# Patient Record
Sex: Male | Born: 1985 | Race: White | Hispanic: No | Marital: Married | Smoking: Never smoker
Health system: Southern US, Community
[De-identification: ages and names within clinical notes are randomized; demographics above are authoritative.]

## PROBLEM LIST (undated history)

## (undated) DIAGNOSIS — F32A Depression, unspecified: Secondary | ICD-10-CM

## (undated) DIAGNOSIS — F329 Major depressive disorder, single episode, unspecified: Secondary | ICD-10-CM

## (undated) HISTORY — PX: WISDOM TOOTH EXTRACTION: SHX21

---
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
## 2006-02-18 ENCOUNTER — Encounter: Admission: RE | Admit: 2006-02-18 | Discharge: 2006-02-18 | Payer: Self-pay | Admitting: Gastroenterology

## 2008-10-13 IMAGING — CT CT ENTERO ABD W/CM
2 of 6 series · 17 of 46 positions shown, 19 images · IV contrast (VOLUMEN & [ID] OMNI 300)
Comparison: Upper GI of 12/10/04.

CLINICAL DATA: Abdominal pain, question Crohn disease.
 CT ENTEROGRAPHY:
TECHNIQUE: Multidetector CT imaging of the abdomen and pelvis was performed following administration of 1852 cc of VoLumen low attenuation enteric contrast.  Images were acquired after bolus injection of nonionic intravenous contrast with coronal and sagittal reformations reviewed for assessment of the small bowel mucosa.

[Series 102: enterography study · axial · 0.78mm/px · z∈[-454,-39]mm · 14 of 190 slices shown, 16 images]
[im 12/190  soft-tissue]
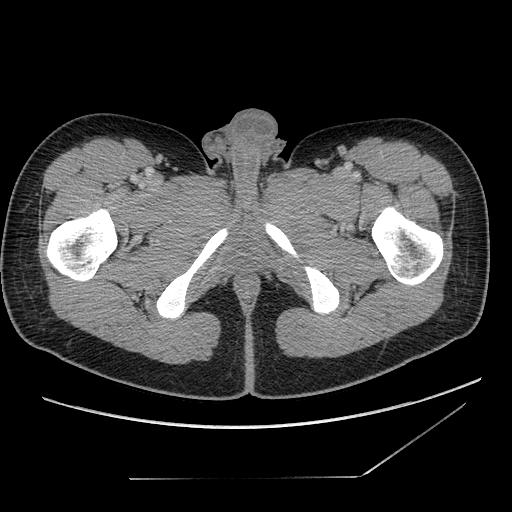
[im 12/190  bone]
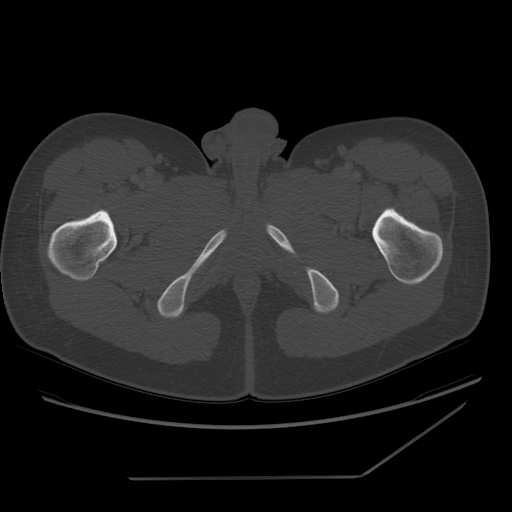
[im 23/190  soft-tissue]
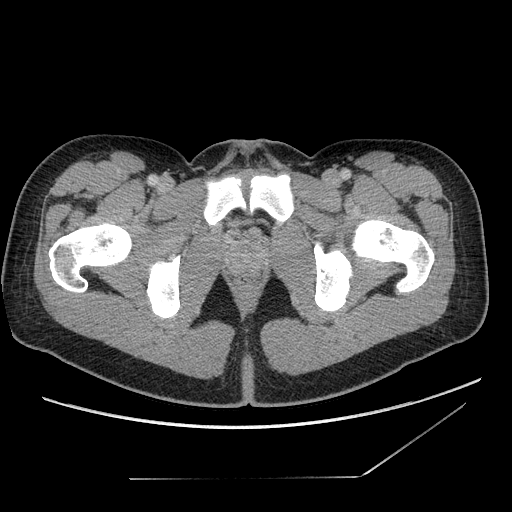
[im 34/190  soft-tissue]
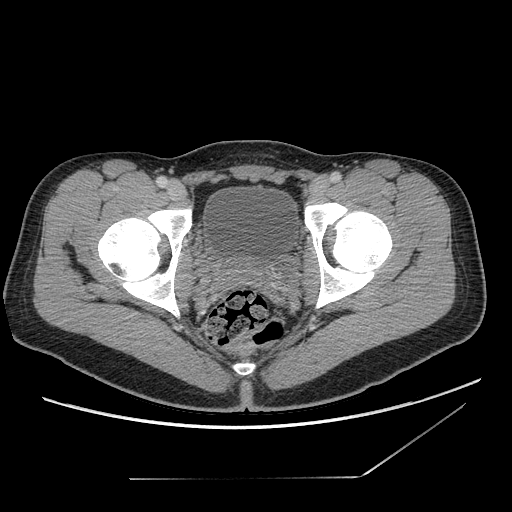
[im 56/190  soft-tissue]
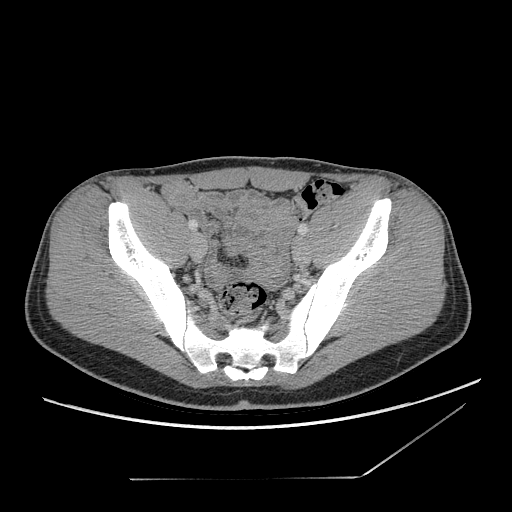
[im 67/190  soft-tissue]
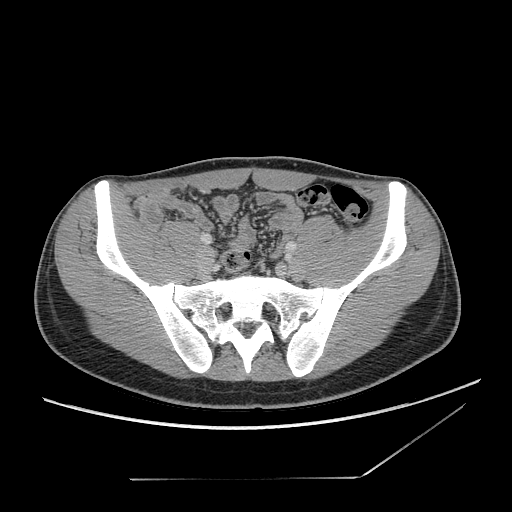
[im 78/190  soft-tissue]
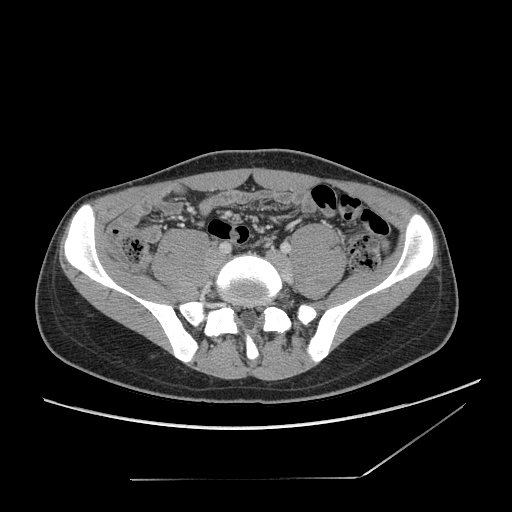
[im 89/190  soft-tissue]
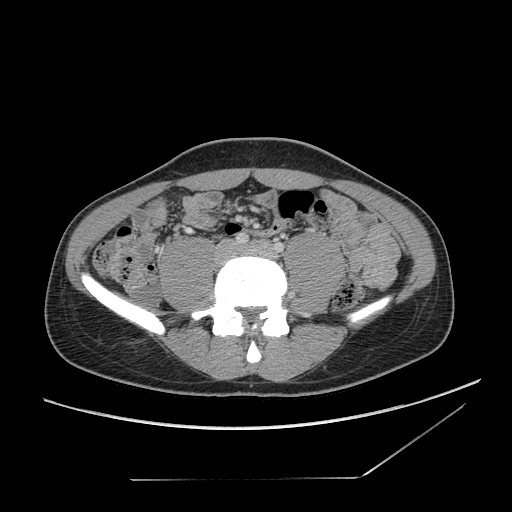
[im 101/190  soft-tissue]
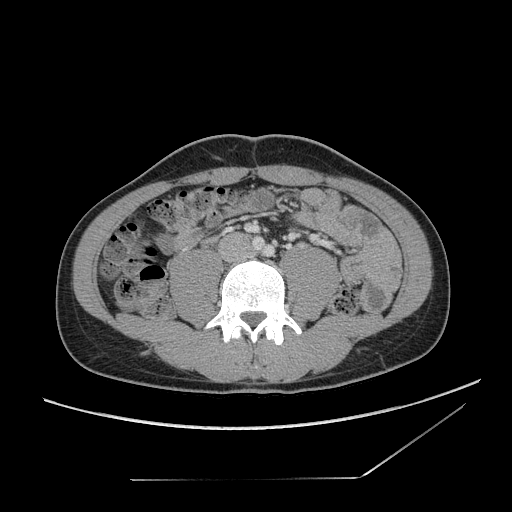
[im 112/190  soft-tissue]
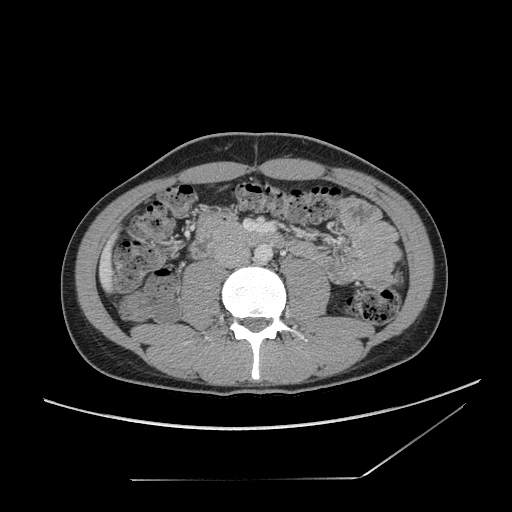
[im 112/190  bone]
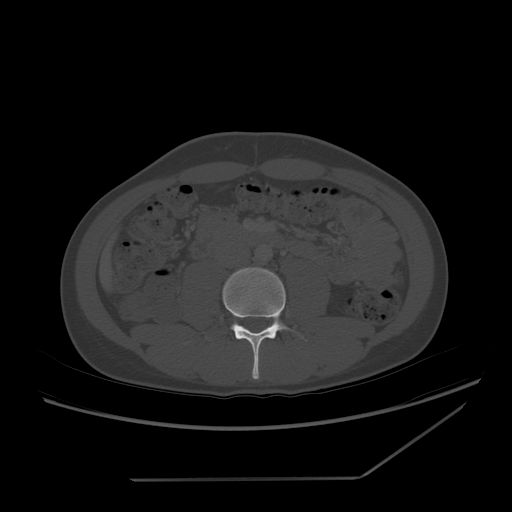
[im 123/190  soft-tissue]
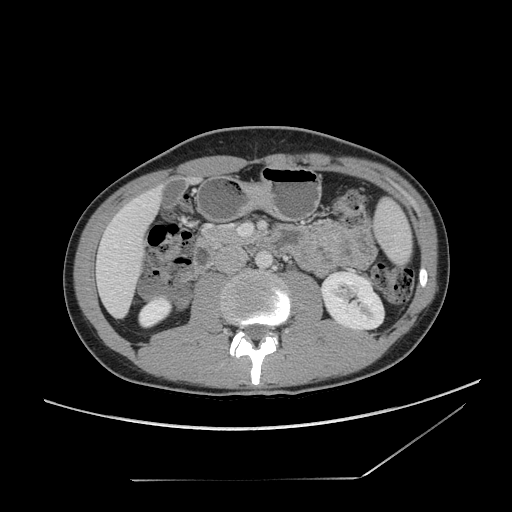
[im 145/190  soft-tissue]
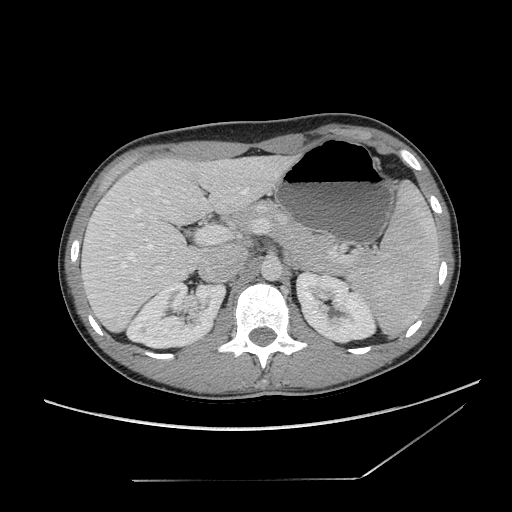
[im 156/190  soft-tissue]
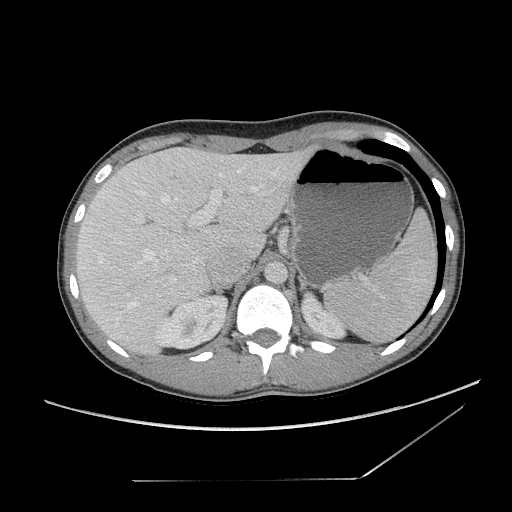
[im 167/190  soft-tissue]
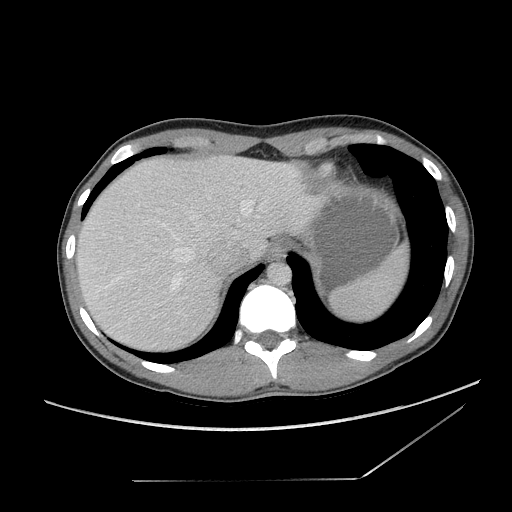
[im 178/190  soft-tissue]
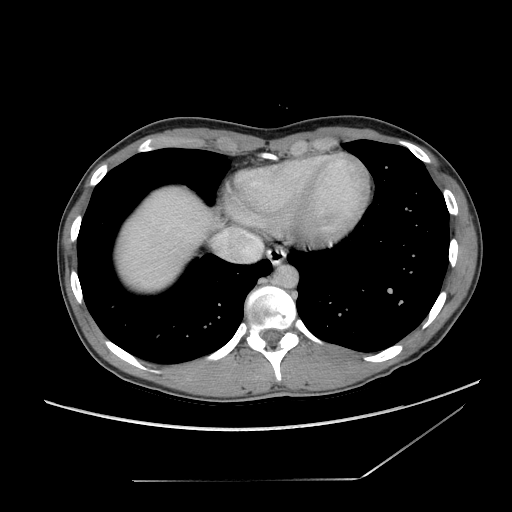

[Series 602: sagittal body · sagittal · 1.00mm/px · 3 of 156 slices shown]
[im 52/156  soft-tissue]
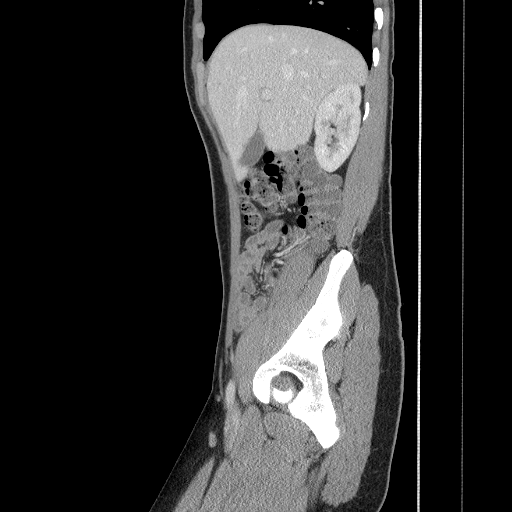
[im 69/156  soft-tissue]
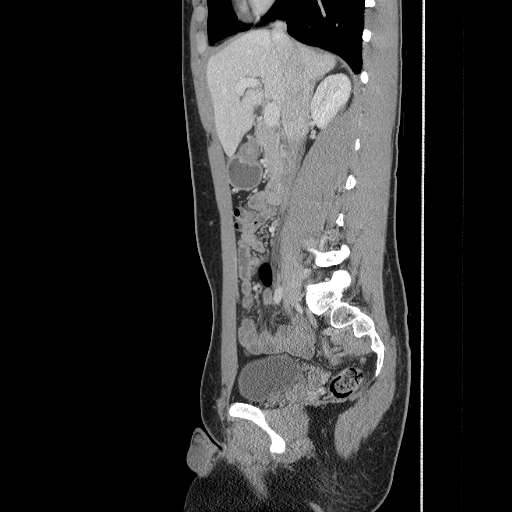
[im 87/156  soft-tissue]
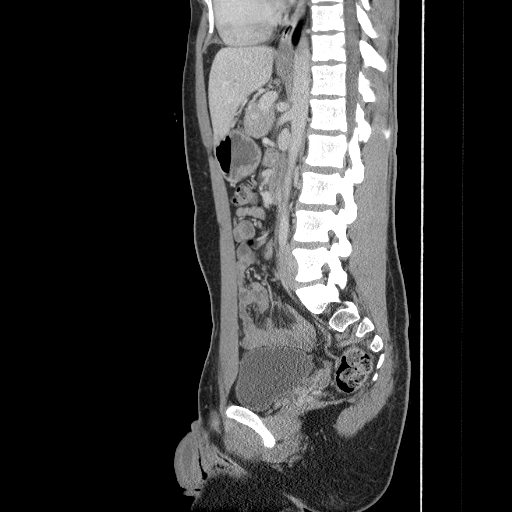

[17 of 46 positions shown; findings below may reference images not displayed]

No prior CT.
 Abdomen:   Lung bases are clear.  The heart size is normal.  There is no pericardial or pleural effusion.  The liver, spleen, stomach, pancreas, gallbladder, adrenal glands, and kidneys are normal.  Numerous right sided renal arteries.  No retroperitoneal or retrocrural adenopathy.
 Colon is normal. The appendix is likely visualized on image 119.  There is no evidence periappendiceal inflammation.  The ileocecal valve on image 104 and the terminal ileum is seen on image 110.  There is no evidence of hyperenhancement or wall thickening involving the terminal ileum.  
 No bowel wall thickening, distention, or stricture.  No perienteric edema or mesenteric adenopathy.
IMPRESSION: No acute process or evidence of Crohn disease in the abdomen.  
 Pelvis:  Pelvic small bowel loops are also normal.  Pelvic large bowel loops are within normal limits.  No ascites.  
 No pelvic adenopathy.  The urinary bladder and prostate are normal for age.  Bone windows demonstrate probable bone island in left iliac wing.
IMPRESSION: No acute findings in the pelvis.

## 2009-08-13 IMAGING — CR DG CHEST 1V
1 series · 2 of 2 positions shown · non-contrast
Comparison: NONE

CLINICAL DATA: Pre-employment. 

CHEST - SINGLE VIEW (PA)

[Series 1: view not recorded · 0.17mm/px · 2 of 2 slices shown]
[im 1/2]
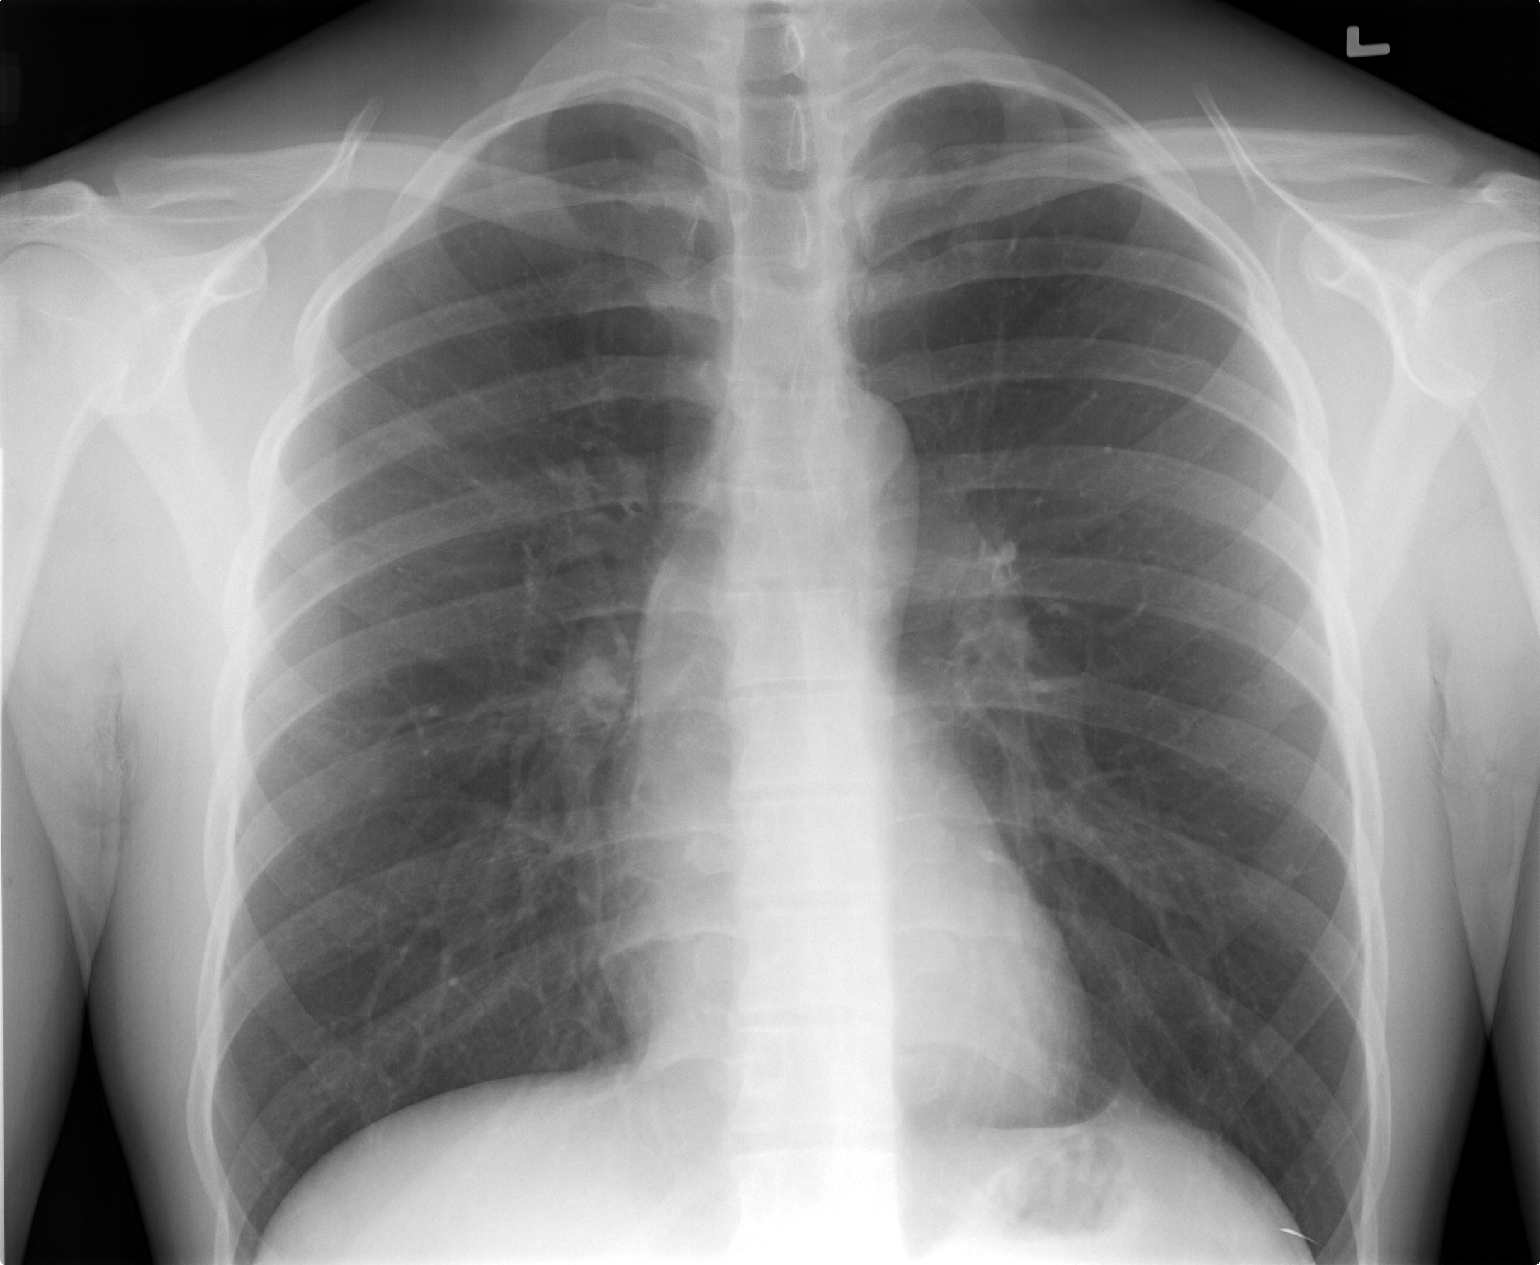
[im 2/2]
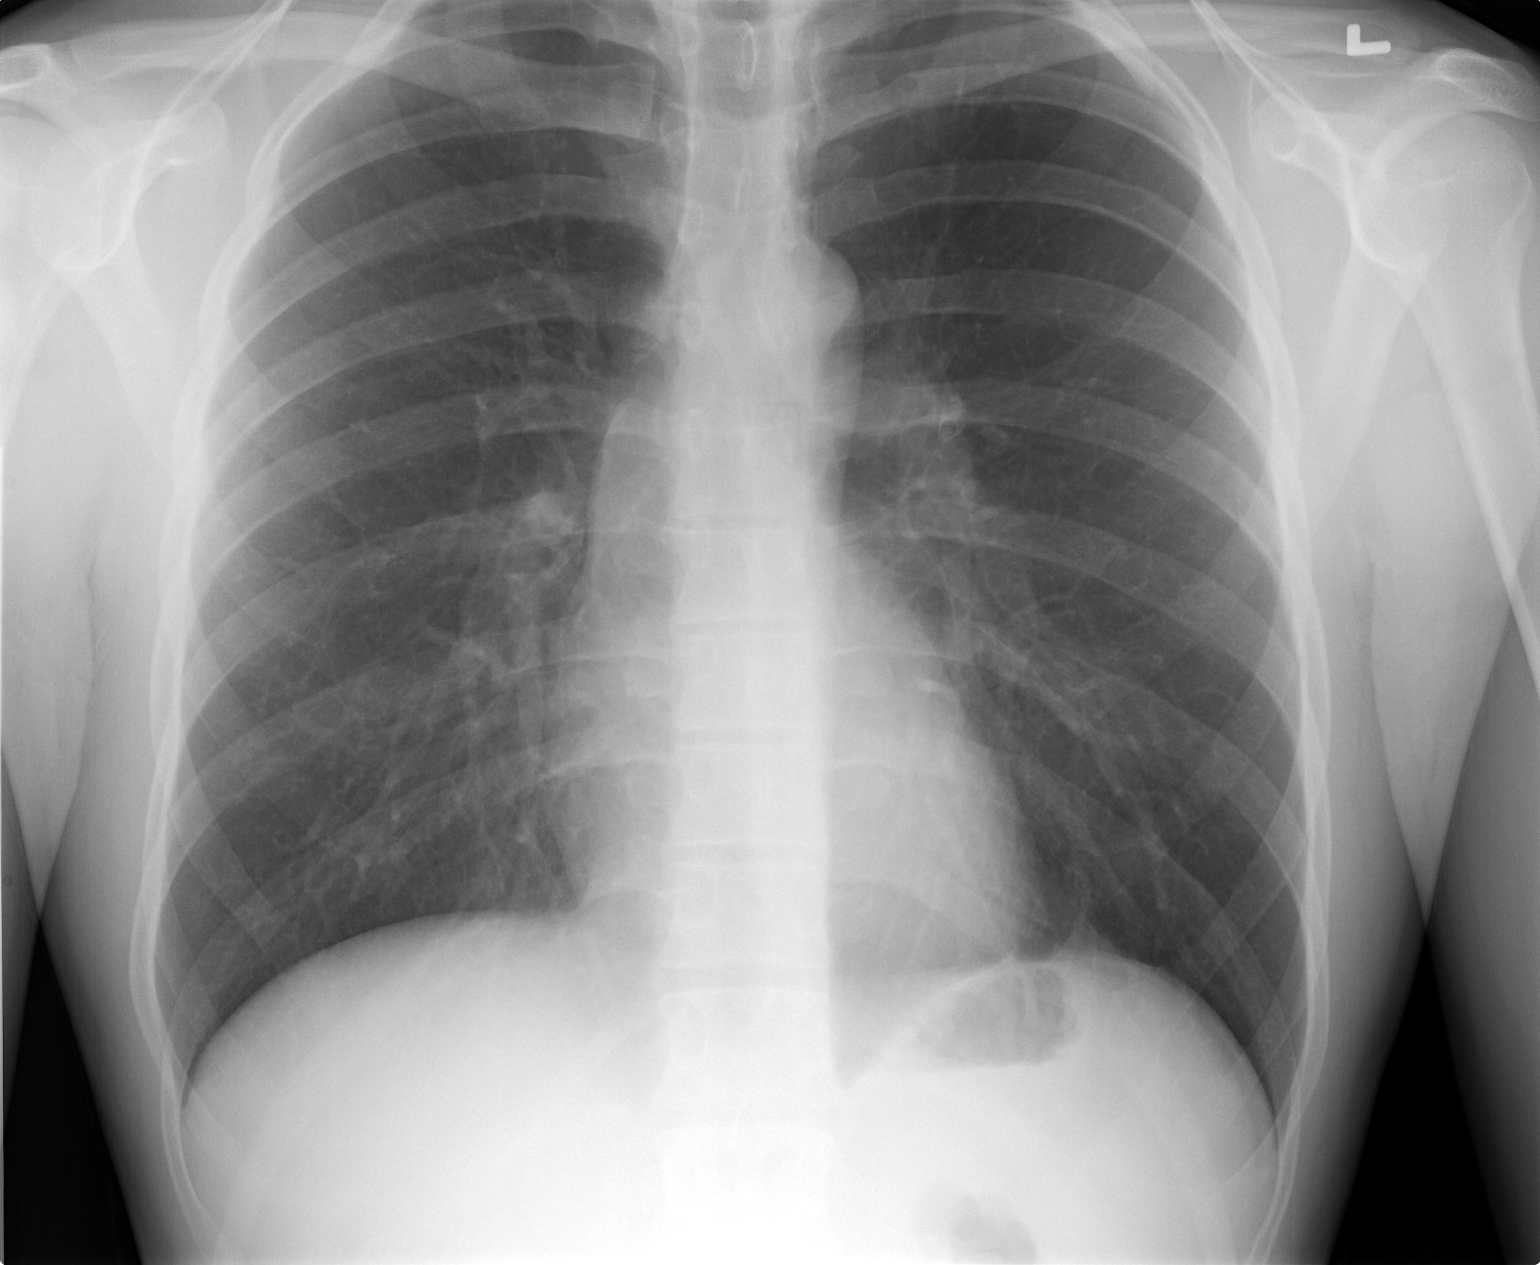

[2 of 2 positions shown; findings below may reference images not displayed]

FINDINGS: The lungs are clear and well expanded.   Heart and 
pulmonary vessels are normal.  No significant abnormalities are 
noted in the regional skeleton. 

electronically reviewed on 12/19/2006 Dict Date: 12/19/2006  Tran 
Date:  12/19/2006 DAS  [REDACTED]

## 2018-12-11 ENCOUNTER — Emergency Department
Admission: EM | Admit: 2018-12-11 | Discharge: 2018-12-11 | Disposition: A | Discharge status: Home / Self Care | Payer: 59 | Source: Home / Self Care

## 2018-12-11 ENCOUNTER — Encounter: Payer: Self-pay | Admitting: *Deleted

## 2018-12-11 ENCOUNTER — Other Ambulatory Visit: Payer: Self-pay

## 2018-12-11 DIAGNOSIS — R6889 Other general symptoms and signs: Secondary | ICD-10-CM

## 2018-12-11 DIAGNOSIS — R059 Cough, unspecified: Secondary | ICD-10-CM

## 2018-12-11 DIAGNOSIS — R05 Cough: Secondary | ICD-10-CM | POA: Diagnosis not present

## 2018-12-11 HISTORY — DX: Depression, unspecified: F32.A

## 2018-12-11 NOTE — ED Provider Notes (Signed)
[facility]    CSN: [id number] Arrival date & time: 12/11/18  1428      History   Chief Complaint Chief Complaint  Patient presents with  . Fatigue  . Headache  . Nausea    HPI Christopher Wagner is a 33 y.o. male with history of depression presenting for flulike symptoms since this morning.  Patient complaining of generalized headache, fatigue, myalgias.  Has not taken any for symptoms currently.  Denies fever, cough, shortness of breath.  No known Covid exposure, though patient does work as a Airline pilot, states his wife works as a Designer, jewellery.  States his wife and children have been asymptomatic.   Past Medical History:  Diagnosis Date  . Depression     There are no active problems to display for this patient.   Past Surgical History:  Procedure Laterality Date  . WISDOM TOOTH EXTRACTION         Home Medications    Prior to Admission medications   Medication Sig Start Date End Date Taking? Authorizing Provider  venlafaxine XR (EFFEXOR-XR) 150 MG 24 hr capsule Take 150 mg by mouth daily. 12/01/18   [provider]    Family History History reviewed. No pertinent family history.  Social History Social History   Tobacco Use  . Smoking status: Never Smoker  . Smokeless tobacco: Current User    Types: Snuff  Substance Use Topics  . Alcohol use: Yes    Comment: occ  . Drug use: Never     Allergies   Patient has no known allergies.   Review of Systems Review of Systems  Constitutional: Positive for fatigue. Negative for activity change, appetite change and fever.  HENT: Negative for congestion, dental problem, ear pain, facial swelling, hearing loss, sinus pain, sore throat, trouble swallowing and voice change.   Eyes: Negative for photophobia, pain and visual disturbance.  Respiratory: Negative for cough and shortness of breath.   Cardiovascular: Negative for chest pain and palpitations.  Gastrointestinal: Negative for  diarrhea and vomiting.  Musculoskeletal: Positive for myalgias. Negative for arthralgias, back pain, gait problem and neck pain.  Neurological: Positive for headaches. Negative for dizziness, tremors, facial asymmetry, speech difficulty, weakness, light-headedness and numbness.     Physical Exam Triage Vital Signs ED Triage Vitals  Enc Vitals Group     BP      Pulse      Resp      Temp      Temp src      SpO2      Weight      Height      Head Circumference      Peak Flow      Pain Score      Pain Loc      Pain Edu?      Excl. in [place]?    No data found.  Updated Vital Signs BP 114/81 (BP Location: Right Arm)   Pulse (!) 122   Temp 98 F (36.7 C) (Tympanic)   Resp 16   Ht 6\' 2"  (1.88 m)   Wt 220 lb (99.8 kg)   SpO2 97%   BMI 28.25 kg/m   Visual Acuity Right Eye Distance:   Left Eye Distance:   Bilateral Distance:    Right Eye Near:   Left Eye Near:    Bilateral Near:     Physical Exam Constitutional:      General: He is not in acute distress.    Appearance:  He is well-developed and normal weight. He is ill-appearing. He is not toxic-appearing or diaphoretic.  HENT:     Head: Normocephalic and atraumatic.     Mouth/Throat:     Mouth: Mucous membranes are moist.     Pharynx: Oropharynx is clear.  Eyes:     General: No scleral icterus.    Extraocular Movements: Extraocular movements intact.     Pupils: Pupils are equal, round, and reactive to light.  Neck:     Musculoskeletal: Normal range of motion and neck supple. No neck rigidity.  Cardiovascular:     Rate and Rhythm: Regular rhythm. Tachycardia present.     Heart sounds: No murmur. No gallop.      Comments: Patient distracted by his 2 sons during appointment Pulmonary:     Effort: Pulmonary effort is normal. No respiratory distress.     Breath sounds: No wheezing or rhonchi.  Musculoskeletal: Normal range of motion.        General: No swelling.  Lymphadenopathy:     Cervical: Cervical adenopathy  present.  Skin:    Capillary Refill: Capillary refill takes less than 2 seconds.     Coloration: Skin is not cyanotic, jaundiced or pale.     Findings: No rash.  Neurological:     Mental Status: He is alert and oriented to person, place, and time.  Psychiatric:        Mood and Affect: Mood normal.        Speech: Speech normal.        Behavior: Behavior normal.      UC Treatments / Results  Labs (all labs ordered are listed, but only abnormal results are displayed) Labs Reviewed  SARS-COV-2 RNA,(COVID-19) QUALITATIVE NAAT  POC SARS CORONAVIRUS 2 AG -  ED    EKG   Radiology No results found.  Procedures Procedures (including critical care time)  Medications Ordered in UC Medications - No data to display  Initial Impression / Assessment and Plan / UC Course  I have reviewed the triage vital signs and the nursing notes.  Pertinent labs & imaging results that were available during my care of the patient were reviewed by me and considered in my medical decision making (see chart for details).     Patient afebrile, nontoxic.  Patient is tachycardic, does appear ill, though well-hydrated and denies chest pain, shortness of breath today.  POC Covid test done in office, reviewed by me: Negative.  This provider reviewed that symptoms are also consistent with flu: Patient declining flu swab today due to time, lack of change in management, having his kids present.  Covid PCR pending: Patient to quarantine until results are back.  Will continue supportive treatment as outlined during appointment.  Return precautions discussed, patient verbalized understanding and is agreeable to plan. Final Clinical Impressions(s) / UC Diagnoses   Final diagnoses:  Cough  Flu-like symptoms     Discharge Instructions     Your COVID test is pending - it is important to quarantine / isolate at home until your results are back. If you test positive and would like further evaluation for persistent  or worsening symptoms, you may schedule an E-visit or virtual (video) visit throughout the [facility] app or website.  PLEASE NOTE: If you develop severe chest pain or shortness of breath please go to the ER or call [phone] for further evaluation --> DO NOT schedule electronic or virtual visits for this. Please call our office for further guidance / recommendations  as needed.    ED Prescriptions    None     PDMP not reviewed this encounter.   Hall[place]vin, [place], [place] 12/11/18 1540

## 2018-12-11 NOTE — Discharge Instructions (Signed)
Your COVID test is pending - it is important to quarantine / isolate at home until your results are back. °If you test positive and would like further evaluation for persistent or worsening symptoms, you may schedule an E-visit or virtual (video) visit throughout the [facility] MyChart app or website. ° °PLEASE NOTE: If you develop severe chest pain or shortness of breath please go to the ER or call [phone] for further evaluation --> DO NOT schedule electronic or virtual visits for this. °Please call our office for further guidance / recommendations as needed. °

## 2018-12-11 NOTE — ED Triage Notes (Signed)
Pt c/o HA, fatigue, and body aches x today.

## 2018-12-13 LAB — SARS-COV-2 RNA,(COVID-19) QUALITATIVE NAAT: SARS CoV2 RNA: NOT DETECTED

## 2023-08-05 ENCOUNTER — Other Ambulatory Visit (HOSPITAL_BASED_OUTPATIENT_CLINIC_OR_DEPARTMENT_OTHER): Payer: Self-pay | Admitting: Family Medicine

## 2023-08-05 DIAGNOSIS — Z8249 Family history of ischemic heart disease and other diseases of the circulatory system: Secondary | ICD-10-CM

## 2023-08-17 ENCOUNTER — Ambulatory Visit (HOSPITAL_BASED_OUTPATIENT_CLINIC_OR_DEPARTMENT_OTHER)
Admission: RE | Admit: 2023-08-17 | Discharge: 2023-08-17 | Disposition: A | Discharge status: Home / Self Care | Payer: Self-pay | Source: Ambulatory Visit | Attending: Family Medicine | Admitting: Family Medicine

## 2023-08-17 DIAGNOSIS — Z8249 Family history of ischemic heart disease and other diseases of the circulatory system: Secondary | ICD-10-CM
